# Patient Record
Sex: Female | Born: 2000 | Race: White | Hispanic: No | Marital: Single | Smoking: Never smoker
Health system: Southern US, Community
[De-identification: ages and names within clinical notes are randomized; demographics above are authoritative.]

## PROBLEM LIST (undated history)

## (undated) DIAGNOSIS — K5190 Ulcerative colitis, unspecified, without complications: Secondary | ICD-10-CM

## (undated) DIAGNOSIS — F419 Anxiety disorder, unspecified: Secondary | ICD-10-CM

## (undated) DIAGNOSIS — Z9889 Other specified postprocedural states: Secondary | ICD-10-CM

## (undated) DIAGNOSIS — H539 Unspecified visual disturbance: Secondary | ICD-10-CM

## (undated) HISTORY — PX: TONSILLECTOMY: SUR1361

---
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
## 2024-05-20 ENCOUNTER — Ambulatory Visit (INDEPENDENT_AMBULATORY_CARE_PROVIDER_SITE_OTHER)

## 2024-05-20 VITALS — BP 100/70 | HR 86 | Temp 97.1°F | Resp 16 | Ht 65.0 in | Wt 213.0 lb

## 2024-05-20 DIAGNOSIS — F902 Attention-deficit hyperactivity disorder, combined type: Secondary | ICD-10-CM | POA: Diagnosis not present

## 2024-05-20 DIAGNOSIS — E6609 Other obesity due to excess calories: Secondary | ICD-10-CM | POA: Diagnosis not present

## 2024-05-20 DIAGNOSIS — Z6835 Body mass index (BMI) 35.0-35.9, adult: Secondary | ICD-10-CM

## 2024-05-20 DIAGNOSIS — E66812 Obesity, class 2: Secondary | ICD-10-CM | POA: Diagnosis not present

## 2024-05-20 DIAGNOSIS — R632 Polyphagia: Secondary | ICD-10-CM | POA: Diagnosis not present

## 2024-05-20 MED ORDER — LISDEXAMFETAMINE DIMESYLATE 20 MG PO CAPS: 20.0000 mg | ORAL_CAPSULE | Freq: Every day | ORAL | 0 refills | Status: DC

## 2024-05-20 NOTE — Progress Notes (Signed)
 Subjective:  Patient ID: Bonnie Garrison, female    DOB: [birth year]  Age: 23 y.o. MRN: [id number]  Chief Complaint  Patient presents with   New Patient (Initial Visit)   Binge eating   Weight Loss    HPI: Discussed the use of AI scribe software for clinical note transcription with the patient, who gave verbal consent to proceed.   Discussed the use of AI scribe software for clinical note transcription with the patient, who gave verbal consent to proceed.  History of Present Illness Bonnie Garrison is a 22 year old female who presents TO ESTABLISH CARE AND  with concerns about binge eating disorder.  Binge eating and weight gain - Significant weight gain despite regular exercise and attempts to maintain a balanced diet - Calorie intake estimated at approximately 2000 calories daily, with inconsistencies in tracking - Struggles with portion control and persistent preoccupation with food - History of food insecurity during childhood, believed to influence current eating behaviors - Impact on relationships and self-esteem - Efforts to make healthy food choices, particularly after her father's recent heart attack  Exercise and lifestyle habits - Engages in regular cardio and strength training - Feels eating habits undermine exercise efforts  Mood disturbance and psychotropic medication - History of depression and anxiety - Recently switched from fluoxetine to Cymbalta 30 mg daily due to side effects from fluoxetine - Started Cymbalta one week ago - No significant depressive symptoms at present  Ulcerative colitis and biologic therapy - History of ulcerative colitis - Receives Remicade infusions every six weeks - Mild side effects from Remicade, but no current pain - Last colonoscopy in June, believed to be normal - Improvement in colonic inflammation since starting Remicade  Substance use - Vapes daily - Consumes alcohol socially, approximately two times per  month, with three to seven drinks per occasion        09/14/2015    1:20 PM  Depression screen PHQ 2/9  Decreased Interest 0  Down, Depressed, Hopeless 0  PHQ - 2 Score 0        09/14/2015    1:20 PM  Fall Risk   Falls in the past year? No      Data saved with a previous flowsheet row definition    Patient Care Team: Kazuko Clemence, MD as PCP - General (Family Medicine)   Review of Systems  Constitutional:  Positive for unexpected weight change. Negative for chills, fatigue and fever.  HENT:  Negative for congestion, ear pain and sore throat.   Respiratory:  Negative for cough and shortness of breath.   Cardiovascular:  Negative for chest pain and palpitations.  Gastrointestinal:  Negative for abdominal pain, constipation, diarrhea, nausea and vomiting.  Endocrine: Negative for polydipsia, polyphagia and polyuria.  Genitourinary:  Negative for difficulty urinating and dysuria.  Musculoskeletal:  Negative for arthralgias, back pain and myalgias.  Skin:  Negative for rash.  Neurological:  Negative for headaches.  Psychiatric/Behavioral:  Positive for decreased concentration and sleep disturbance. Negative for dysphoric mood. The patient is nervous/anxious.     Current Outpatient Medications on File Prior to Visit  Medication Sig Dispense Refill   acetaminophen  (TYLENOL ) 325 MG tablet Take 650 mg by mouth every 6 (six) hours as needed for mild pain or headache.     DULoxetine (CYMBALTA) 30 MG capsule Take by mouth.     inFLIXimab (REMICADE) 100 MG injection Inject 100 mg into the vein.     No current facility-administered medications on  file prior to visit.   Past Medical History:  Diagnosis Date   Anxiety    History of esophagogastroduodenoscopy (EGD) 09/2016   Ulcerative colitis (HCC)    Vision abnormalities    wears glasses but they are broken   Past Surgical History:  Procedure Laterality Date   TONSILLECTOMY     2008    Family History  Problem Relation Age  of Onset   Diabetes Mother    Bipolar disorder Mother    Bipolar disorder Father    Anxiety disorder Father    Depression Father    Bipolar disorder Sister        23yo sister   Schizophrenia Sister        25yo sister   Schizophrenia Brother    Social History   Socioeconomic History   Marital status: Single    Spouse name: Not on file   Number of children: Not on file   Years of education: Not on file   Highest education level: Not on file  Occupational History   Not on file  Tobacco Use   Smoking status: Never   Smokeless tobacco: Never  Vaping Use   Vaping status: Every Day  Substance and Sexual Activity   Alcohol use: Yes    Alcohol/week: 3.0 standard drinks of alcohol    Types: 1 Glasses of wine, 1 Cans of beer, 1 Standard drinks or equivalent per week    Comment: socially   Drug use: Yes    Frequency: 4.0 times per week    Types: Marijuana   Sexual activity: Yes    Partners: Male    Birth control/protection: I.U.D.  Other Topics Concern   Not on file  Social History Narrative   Not on file   Social Drivers of Health   Financial Resource Strain: Not on file  Food Insecurity: Low Risk  (02/25/2023)   Received from [facility]   Hunger Vital Sign    Within the past 12 months, you worried that your food would run out before you got money to buy more: Never true    Within the past 12 months, the food you bought just didn't last and you didn't have money to get more. : Never true  Transportation Needs: No Transportation Needs (02/25/2023)   Received from [facility]    In the past 12 months, has lack of reliable transportation kept you from medical appointments, meetings, work or from getting things needed for daily living? : No  Physical Activity: Not on file  Stress: Not on file  Social Connections: Not on file    Objective:  BP 100/70   Pulse 86   Temp (!) 97.1 F (36.2 C)   Resp 16   Ht 5' 5 (1.651 m)   Wt 213 lb (96.6 kg)    LMP 05/05/2024 (Exact Date)   SpO2 96%   BMI 35.45 kg/m      05/20/2024   11:21 AM 05/29/2017    6:24 AM 05/29/2017    6:22 AM  BP/Weight  Systolic BP 100    Diastolic BP 70    Wt. (Lbs) 213    BMI 35.45 kg/m2       Information is confidential and restricted. Go to Review Flowsheets to unlock data.    Physical Exam Vitals and nursing note reviewed.  Constitutional:      Appearance: She is obese.  HENT:     Head: Normocephalic and atraumatic.  Cardiovascular:  Rate and Rhythm: Normal rate and regular rhythm.  Pulmonary:     Effort: Pulmonary effort is normal.     Breath sounds: Normal breath sounds.  Musculoskeletal:        General: Normal range of motion.  Neurological:     General: No focal deficit present.     Mental Status: She is alert and oriented to person, place, and time.  Psychiatric:        Mood and Affect: Mood normal.         Lab Results  Component Value Date   WBC 11.0 05/07/2017   HGB 12.8 05/07/2017   HCT 38.1 05/07/2017   PLT 141 (L) 05/07/2017   GLUCOSE 84 05/24/2017   CHOL 160 05/24/2017   TRIG 86 05/24/2017   HDL 47 05/24/2017   LDLCALC 96 05/24/2017   ALT 20 05/24/2017   AST 19 05/24/2017   NA 140 05/24/2017   K 4.0 05/25/2017   CL 105 05/24/2017   CREATININE 0.63 05/24/2017   BUN 11 05/24/2017   CO2 25 05/24/2017   TSH 1.165 05/24/2017   HGBA1C 5.3 05/24/2017      Assessment & Plan:  Binge eating Assessment & Plan: Binge eating disorder with associated obesity. Reports being the heaviest she has ever been, despite regular exercise and attempts at a balanced diet. Describes hyperfocus on food and difficulty with portion control, leading to guilt and self-disgust. Childhood food insecurity may contribute to current behaviors. Current weight is 213 pounds with weight fluctuations. Engages in cardio and strength training but struggles with dietary control. - Prescribe Vyvanse  20 mg capsules for binge eating disorder, pending  insurance approval - Consider Wegovy if Vyvanse  is not covered by insurance - Encourage continued healthy shopping and meal preparation - Advise on maintaining regular exercise regimen, aiming for 60 minutes of exercise five times a week - Schedule follow-up appointment in two months for weight management evaluation   Class 2 obesity due to excess calories without serious comorbidity with body mass index (BMI) of 35.0 to 35.9 in adult Assessment & Plan: Could be multifactorial. Binge eating could be a major contributory factor. Started vyvanse . Lifestyle modifications recommended. Will continue to monitor   ADHD (attention deficit hyperactivity disorder), combined type Assessment & Plan: Started on vyvanse  for both binge eating and ADHD diagnosis. Hoping her insurance covers the medication. Recommend to have a structured lifestyle, reminders, add regular exercise to help with focus.   Other orders -     Lisdexamfetamine Dimesylate ; Take 1 capsule (20 mg total) by mouth daily.  Dispense: 30 capsule; Refill: 0     Body mass index is 35.45 kg/m.  Assessment and Plan    Assessment and Plan Assessment & Plan   Major depressive disorder, recurrent, moderate Recently switched from fluoxetine to Cymbalta due to side effects. Cymbalta is weight neutral and should not contribute to weight gain. Reports no significant depressive symptoms beyond typical levels. - Continue Cymbalta 30 mg daily - Coordinate with psychiatrist for ongoing management of depression  Nicotine dependence (vaping) Vapes daily and has attempted to quit multiple times unsuccessfully. Acknowledges health risks and desires cessation. - Encourage reduction in vaping with the goal of cessation  Alcohol use, episodic binge drinking Consumes alcohol socially, approximately twice a month, with intake ranging from three to seven drinks per occasion. Educated on binge drinking and advised to limit intake to less than  four drinks per occasion to reduce liver strain. - Advise limiting alcohol intake to less  than four drinks per occasion    Meds ordered this encounter  Medications   lisdexamfetamine (VYVANSE ) 20 MG capsule    Sig: Take 1 capsule (20 mg total) by mouth daily.    Dispense:  30 capsule    Refill:  0    No orders of the defined types were placed in this encounter.      Follow-up: Return in about 8 weeks (around 07/15/2024) for weight management.    An After Visit Summary was printed and given to the patient.  Bonnie Manthe, MD [facility] [phone]

## 2024-05-20 NOTE — Patient Instructions (Signed)
 Trial vyvanse  for binge eating Avoid processed and sugar sweetened foods and beverages. Be conscious of calorie intake. Break you fast with protein and make protein a priority. Celebrate non scale victories. Continue recommended medications. Continue self monitoring with frequent weigh-ins. Dedicate time to structured aerobic activity and resistance training. Eat more low glycemic fruits and vegetables. Ensure adequate sleep, 7 to 8 hours to minimize cravings and advance activity goals. Establish an accountability system. Find small opportunities to keep moving. Follow-up frequently with primary care. Make your goals SMART-specific, measurable, attainable, realistic, time-based. Prepare more meals at home. Prioritize your mental health and stress management.

## 2024-05-23 NOTE — Assessment & Plan Note (Signed)
 Started on vyvanse  for both binge eating and ADHD diagnosis. Hoping her insurance covers the medication. Recommend to have a structured lifestyle, reminders, add regular exercise to help with focus.

## 2024-05-23 NOTE — Assessment & Plan Note (Signed)
 Could be multifactorial. Binge eating could be a major contributory factor. Started vyvanse . Lifestyle modifications recommended. Will continue to monitor

## 2024-05-23 NOTE — Assessment & Plan Note (Signed)
 Binge eating disorder with associated obesity. Reports being the heaviest she has ever been, despite regular exercise and attempts at a balanced diet. Describes hyperfocus on food and difficulty with portion control, leading to guilt and self-disgust. Childhood food insecurity may contribute to current behaviors. Current weight is 213 pounds with weight fluctuations. Engages in cardio and strength training but struggles with dietary control. - Prescribe Vyvanse  20 mg capsules for binge eating disorder, pending insurance approval - Consider Wegovy if Vyvanse  is not covered by insurance - Encourage continued healthy shopping and meal preparation - Advise on maintaining regular exercise regimen, aiming for 60 minutes of exercise five times a week - Schedule follow-up appointment in two months for weight management evaluation

## 2024-07-04 ENCOUNTER — Other Ambulatory Visit: Payer: Self-pay

## 2024-07-04 MED ORDER — LISDEXAMFETAMINE DIMESYLATE 20 MG PO CAPS: 20.0000 mg | ORAL_CAPSULE | Freq: Every day | ORAL | 0 refills | Status: AC

## 2024-07-04 NOTE — Telephone Encounter (Signed)
 Copied from CRM [id number]. Topic: Clinical - Medication Refill >> Jul 04, 2024  9:15 AM Alexandria E wrote: Medication: lisdexamfetamine (VYVANSE ) 20 MG capsule  Has the patient contacted their pharmacy? Yes (Agent: If no, request that the patient contact the pharmacy for the refill. If patient does not wish to contact the pharmacy document the reason why and proceed with request.) (Agent: If yes, when and what did the pharmacy advise?)  This is the patient's preferred pharmacy:  [facility] [address], [place] - [address] [facility] [place] [address] Phone: [phone] Fax: [phone]  Is this the correct pharmacy for this prescription? Yes If no, delete pharmacy and type the correct one.   Has the prescription been filled recently? No  Is the patient out of the medication? No, 2 pills left.  Has the patient been seen for an appointment in the last year OR does the patient have an upcoming appointment? Yes  Can we respond through MyChart? No  Agent: Please be advised that Rx refills may take up to 3 business days. We ask that you follow-up with your pharmacy.

## 2024-07-12 ENCOUNTER — Ambulatory Visit (INDEPENDENT_AMBULATORY_CARE_PROVIDER_SITE_OTHER)

## 2024-07-12 VITALS — BP 110/64 | HR 105 | Temp 98.2°F | Ht 65.0 in | Wt 219.0 lb

## 2024-07-12 DIAGNOSIS — F314 Bipolar disorder, current episode depressed, severe, without psychotic features: Secondary | ICD-10-CM

## 2024-07-12 DIAGNOSIS — E66812 Obesity, class 2: Secondary | ICD-10-CM | POA: Diagnosis not present

## 2024-07-12 DIAGNOSIS — E6609 Other obesity due to excess calories: Secondary | ICD-10-CM

## 2024-07-12 DIAGNOSIS — R632 Polyphagia: Secondary | ICD-10-CM | POA: Diagnosis not present

## 2024-07-12 DIAGNOSIS — Z6836 Body mass index (BMI) 36.0-36.9, adult: Secondary | ICD-10-CM

## 2024-07-12 NOTE — Patient Instructions (Signed)
  VISIT SUMMARY: Today, we discussed your ongoing treatment for depression, anxiety, binge eating disorder, and ulcerative colitis. We reviewed your current medications and their effects, and we talked about your exercise routine and upcoming gynecology appointment.  YOUR PLAN: OBESITY, CLASS 2 AND BINGE EATING DISORDER: You have been diagnosed with class 2 obesity and binge eating disorder. Your current medication, Vyvanse , has been helping with focus and productivity, though you still experience dry mouth. -Continue taking Vyvanse  20 mg daily. -Monitor your response to Vyvanse  and let us  know if you experience any side effects like insomnia or if you feel the dose needs adjustment. -Keep up with your exercise routine of going to the gym twice a week and using the treadmill at home three times a week.  BIPOLAR DEPRESSION: Your depression, characterized as bipolar depression, is being managed with Cymbalta. Regular exercise has been beneficial for your mood. -Continue taking Cymbalta 60 mg daily. -Maintain your regular exercise routine to help support your mental health.  ULCERATIVE COLITIS: Your ulcerative colitis is being managed with Remicade infusions every six weeks. -Continue with your Remicade infusions as scheduled. Your next infusion is on November 24th.                      Contains text generated by [employer].                                 Contains text generated by [employer].

## 2024-07-12 NOTE — Assessment & Plan Note (Addendum)
 Bipolar depression Managed with Cymbalta 60 mg daily. Psychiatrist is evaluating for bipolar personality disorder. Exercise has positively impacted mood and overall well-being. - Continue Cymbalta 60 mg daily. - Encouraged regular exercise to support mental health.    Ulcerative colitis Managed with Remicade infusions every six weeks. Last infusion was on October 3rd, with the next scheduled for November 24th. - Continue Remicade infusions every six weeks.

## 2024-07-12 NOTE — Assessment & Plan Note (Addendum)
 Obesity, class 2 and binge eating disorder Obesity class 2 with binge eating disorder. Vyvanse  20 mg daily has improved focus and productivity, though some days are more challenging. Initial side effects of insomnia and headaches have resolved. Dry mouth persists but is manageable. Exercise routine includes gym visits twice a week and treadmill use three times a week. - Continue Vyvanse  20 mg daily IT WAS LAST FILLED 07/04/24 - Monitor response to Vyvanse  and consider dose adjustment if needed, with awareness of potential side effects such as insomnia and dry mouth. - Encouraged continuation of current exercise routine.

## 2024-07-12 NOTE — Progress Notes (Signed)
 Subjective:  Patient ID: Bonnie Garrison, female    DOB: 30-Mar-2001  Age: 24 y.o. MRN: [id number]  Chief Complaint  Patient presents with   Medical Management of Chronic Issues    HPI: Discussed the use of AI scribe software for clinical note transcription with the patient, who gave verbal consent to proceed.   History of Present Illness   Bonnie Garrison is a 23 year old female with depression, anxiety, and ulcerative colitis who presents for follow-up on medication management and binge eating.  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 - Currently taking Cymbalta 60 mg daily for mood stabilization, depression, and anxiety - Mood improves with regular exercise: gym twice weekly, treadmill at home three times weekly  Binge eating disorder and stimulant therapy - Started on Vyvanse  20 mg daily for binge eating disorder; insurance approved - Initial side effects included insomnia and headaches, both resolved - Current side effect of dry mouth, managed with increased water intake and gum - Improved focus and productivity at work - Decreased preoccupation with food and more regular hunger cues - Increased food cravings around menstrual cycle  Ulcerative colitis management - Receiving Remicade infusions every six weeks - Last infusion on October 3rd; next scheduled for November 24th  Gynecologic health and contraception - Paragard IUD in place - Scheduled for gynecology follow-up tomorrow - No prior Pap smear; considering Pap smear at upcoming appointment - No need for chlamydia, HIV, or hepatitis C testing - No need for influenza or COVID-19 vaccination - In a relationship and does not desire STI testing            09/14/2015    1:20 PM  Depression screen PHQ 2/9  Decreased Interest 0  Down, Depressed, Hopeless 0  PHQ - 2 Score 0        09/14/2015    1:20  PM  Fall Risk   Falls in the past year? No      Data saved with a previous flowsheet row definition    Patient Care Team: Norvell Ureste, MD as PCP - General (Family Medicine)   Review of Systems  Constitutional: Negative.   HENT: Negative.    Respiratory: Negative.    Cardiovascular: Negative.   Musculoskeletal: Negative.   Neurological: Negative.   Psychiatric/Behavioral:  Positive for decreased concentration.   All other systems reviewed and are negative.   Current Outpatient Medications on File Prior to Visit  Medication Sig Dispense Refill   DULoxetine (CYMBALTA) 60 MG capsule Take 60 mg by mouth daily.     inFLIXimab (REMICADE) 100 MG injection Inject 100 mg into the vein.     lisdexamfetamine (VYVANSE ) 20 MG capsule Take 1 capsule (20 mg total) by mouth daily. 30 capsule 0   No current facility-administered medications on file prior to visit.   Past Medical History:  Diagnosis Date   Anxiety    History of esophagogastroduodenoscopy (EGD) 09/2016   Ulcerative colitis (HCC)    Vision abnormalities    wears glasses but they are broken   Past Surgical History:  Procedure Laterality Date   TONSILLECTOMY     2008    Family History  Problem Relation Age of Onset   Diabetes Mother    Bipolar disorder Mother    Bipolar disorder Father    Anxiety disorder Father    Depression Father    Bipolar disorder Sister  23yo sister   Schizophrenia Sister        25yo sister   Schizophrenia Brother    Social History   Socioeconomic History   Marital status: Single    Spouse name: Not on file   Number of children: Not on file   Years of education: Not on file   Highest education level: Not on file  Occupational History   Not on file  Tobacco Use   Smoking status: Never   Smokeless tobacco: Never  Vaping Use   Vaping status: Every Day  Substance and Sexual Activity   Alcohol use: Yes    Alcohol/week: 3.0 standard drinks of alcohol    Types: 1 Glasses of  wine, 1 Cans of beer, 1 Standard drinks or equivalent per week    Comment: socially   Drug use: Yes    Frequency: 4.0 times per week    Types: Marijuana   Sexual activity: Yes    Partners: Male    Birth control/protection: I.U.D.  Other Topics Concern   Not on file  Social History Narrative   Not on file   Social Drivers of Health   Financial Resource Strain: Not on file  Food Insecurity: No Food Insecurity (07/12/2024)   Hunger Vital Sign    Worried About Running Out of Food in the Last Year: Never true    Ran Out of Food in the Last Year: Never true  Transportation Needs: No Transportation Needs (07/12/2024)   PRAPARE - Administrator, Civil Service (Medical): No    Lack of Transportation (Non-Medical): No  Physical Activity: Not on file  Stress: Not on file  Social Connections: Not on file    Objective:  BP 110/64   Pulse (!) 105   Temp 98.2 F (36.8 C)   Ht 5' 5 (1.651 m)   Wt 219 lb (99.3 kg)   LMP 06/06/2024   SpO2 95%   BMI 36.44 kg/m      07/12/2024    8:51 AM 05/20/2024   11:21 AM 05/29/2017    6:24 AM  BP/Weight  Systolic BP 110 100   Diastolic BP 64 70   Wt. (Lbs) 219 213   BMI 36.44 kg/m2 35.45 kg/m2      Information is confidential and restricted. Go to Review Flowsheets to unlock data.    Physical Exam      Lab Results  Component Value Date   WBC 11.0 05/07/2017   HGB 12.8 05/07/2017   HCT 38.1 05/07/2017   PLT 141 (L) 05/07/2017   GLUCOSE 84 05/24/2017   CHOL 160 05/24/2017   TRIG 86 05/24/2017   HDL 47 05/24/2017   LDLCALC 96 05/24/2017   ALT 20 05/24/2017   AST 19 05/24/2017   NA 140 05/24/2017   K 4.0 05/25/2017   CL 105 05/24/2017   CREATININE 0.63 05/24/2017   BUN 11 05/24/2017   CO2 25 05/24/2017   TSH 1.165 05/24/2017   HGBA1C 5.3 05/24/2017    Results for orders placed or performed during the hospital encounter of 05/07/17  Rapid strep screen   Collection Time: 05/07/17  5:35 PM   Specimen: Oral  Mucosa/Gingiva; Other  Result Value Ref Range   Streptococcus, Group A Screen (Direct) NEGATIVE NEGATIVE  Culture, group A strep   Collection Time: 05/07/17  5:35 PM   Specimen: Throat  Result Value Ref Range   Specimen Description THROAT    Special Requests NONE Reflexed from [phone]  Culture      NO GROUP A STREP (S.PYOGENES) ISOLATED Performed at [facility], [address], [place], [place] [address]    Report Status 05/10/2017 FINAL   CBC with Differential   Collection Time: 05/07/17  5:35 PM  Result Value Ref Range   WBC 11.0 3.6 - 11.0 K/uL   RBC 4.34 3.80 - 5.20 MIL/uL   Hemoglobin 12.8 12.0 - 16.0 g/dL   HCT 61.8 64.9 - 52.9 %   MCV 87.8 80.0 - 100.0 fL   MCH 29.5 26.0 - 34.0 pg   MCHC 33.6 32.0 - 36.0 g/dL   RDW 86.9 88.4 - 85.4 %   Platelets 141 (L) 150 - 440 K/uL   Neutrophils Relative % 55 %   Neutro Abs 6.1 1.4 - 6.5 K/uL   Lymphocytes Relative 32 %   Lymphs Abs 3.5 1.0 - 3.6 K/uL   Monocytes Relative 8 %   Monocytes Absolute 0.9 0.2 - 0.9 K/uL   Eosinophils Relative 4 %   Eosinophils Absolute 0.4 0 - 0.7 K/uL   Basophils Relative 1 %   Basophils Absolute 0.1 0 - 0.1 K/uL  HIV antibody   Collection Time: 05/07/17  5:35 PM  Result Value Ref Range   HIV Screen 4th Generation wRfx Non Reactive Non Reactive  RPR   Collection Time: 05/07/17  5:35 PM  Result Value Ref Range   RPR Ser Ql Non Reactive Non Reactive  .  Assessment & Plan:   Assessment & Plan Binge eating Continue Vyvanse  20 mg daily    Class 2 obesity due to excess calories without serious comorbidity with body mass index (BMI) of 36.0 to 36.9 in adult  Obesity, class 2 and binge eating disorder Obesity class 2 with binge eating disorder. Vyvanse  20 mg daily has improved focus and productivity, though some days are more challenging. Initial side effects of insomnia and headaches have resolved. Dry mouth persists but is manageable. Exercise routine includes gym visits twice a week and  treadmill use three times a week. - Continue Vyvanse  20 mg daily IT WAS LAST FILLED 07/04/24 - Monitor response to Vyvanse  and consider dose adjustment if needed, with awareness of potential side effects such as insomnia and dry mouth. - Encouraged continuation of current exercise routine.    Bipolar disorder, current episode depressed, severe, without psychotic features (HCC) Bipolar depression Managed with Cymbalta 60 mg daily. Psychiatrist is evaluating for bipolar personality disorder. Exercise has positively impacted mood and overall well-being. - Continue Cymbalta 60 mg daily. - Encouraged regular exercise to support mental health.    Ulcerative colitis Managed with Remicade infusions every six weeks. Last infusion was on October 3rd, with the next scheduled for November 24th. - Continue Remicade infusions every six weeks.         Body mass index is 36.44 kg/m.        No orders of the defined types were placed in this encounter.   No orders of the defined types were placed in this encounter.      Follow-up: Return for chronic disease follow up.  An After Visit Summary was printed and given to the patient.  Pakou Rainbow, MD [facility] [phone]

## 2024-07-12 NOTE — Assessment & Plan Note (Addendum)
 Continue Vyvanse 20mg  daily

## 2024-07-15 ENCOUNTER — Ambulatory Visit

## 2024-08-28 ENCOUNTER — Other Ambulatory Visit: Payer: Self-pay

## 2024-08-29 MED ORDER — LISDEXAMFETAMINE DIMESYLATE 30 MG PO CAPS: 30.0000 mg | ORAL_CAPSULE | Freq: Every day | ORAL | 0 refills | Status: AC
